# Patient Record
Sex: Female | Born: 1970 | Race: White | Hispanic: No | Marital: Married | Smoking: Current every day smoker
Health system: Southern US, Community
[De-identification: ages and names within clinical notes are randomized; demographics above are authoritative.]

## PROBLEM LIST (undated history)

## (undated) DIAGNOSIS — F419 Anxiety disorder, unspecified: Secondary | ICD-10-CM

## (undated) DIAGNOSIS — E119 Type 2 diabetes mellitus without complications: Secondary | ICD-10-CM

## (undated) DIAGNOSIS — E785 Hyperlipidemia, unspecified: Secondary | ICD-10-CM

## (undated) DIAGNOSIS — T7840XA Allergy, unspecified, initial encounter: Secondary | ICD-10-CM

## (undated) DIAGNOSIS — F32A Depression, unspecified: Secondary | ICD-10-CM

## (undated) HISTORY — DX: Allergy, unspecified, initial encounter: T78.40XA

## (undated) HISTORY — DX: Depression, unspecified: F32.A

## (undated) HISTORY — DX: Type 2 diabetes mellitus without complications: E11.9

## (undated) HISTORY — DX: Hyperlipidemia, unspecified: E78.5

## (undated) HISTORY — DX: Anxiety disorder, unspecified: F41.9

---

## 1998-01-25 ENCOUNTER — Inpatient Hospital Stay (HOSPITAL_COMMUNITY): Admission: EM | Admit: 1998-01-25 | Discharge: 1998-01-27 | Payer: Self-pay | Admitting: Emergency Medicine

## 1999-12-23 ENCOUNTER — Other Ambulatory Visit: Admission: RE | Admit: 1999-12-23 | Discharge: 1999-12-23 | Payer: Self-pay | Admitting: Obstetrics & Gynecology

## 2001-01-04 ENCOUNTER — Other Ambulatory Visit: Admission: RE | Admit: 2001-01-04 | Discharge: 2001-01-04 | Payer: Self-pay | Admitting: Obstetrics & Gynecology

## 2002-08-03 ENCOUNTER — Encounter: Admission: RE | Admit: 2002-08-03 | Discharge: 2002-08-03 | Payer: Self-pay | Admitting: Family Medicine

## 2002-08-03 ENCOUNTER — Encounter: Payer: Self-pay | Admitting: Family Medicine

## 2002-08-09 ENCOUNTER — Other Ambulatory Visit: Admission: RE | Admit: 2002-08-09 | Discharge: 2002-08-09 | Payer: Self-pay | Admitting: Obstetrics & Gynecology

## 2002-11-17 ENCOUNTER — Encounter (INDEPENDENT_AMBULATORY_CARE_PROVIDER_SITE_OTHER): Payer: Self-pay | Admitting: Specialist

## 2002-11-17 ENCOUNTER — Ambulatory Visit (HOSPITAL_COMMUNITY): Admission: RE | Admit: 2002-11-17 | Discharge: 2002-11-17 | Payer: Self-pay | Admitting: Gastroenterology

## 2005-02-26 ENCOUNTER — Encounter: Admission: RE | Admit: 2005-02-26 | Discharge: 2005-02-26 | Payer: Self-pay | Admitting: Family Medicine

## 2005-03-06 ENCOUNTER — Encounter: Admission: RE | Admit: 2005-03-06 | Discharge: 2005-03-06 | Payer: Self-pay | Admitting: Family Medicine

## 2005-03-13 ENCOUNTER — Encounter: Admission: RE | Admit: 2005-03-13 | Discharge: 2005-03-13 | Payer: Self-pay | Admitting: Internal Medicine

## 2005-03-28 ENCOUNTER — Encounter: Admission: RE | Admit: 2005-03-28 | Discharge: 2005-03-28 | Payer: Self-pay | Admitting: Family Medicine

## 2006-07-07 ENCOUNTER — Encounter: Admission: RE | Admit: 2006-07-07 | Discharge: 2006-07-07 | Payer: Self-pay | Admitting: Family Medicine

## 2006-11-08 IMAGING — CT CT ABDOMEN W/ CM
1 of 3 series · 14 of 32 positions shown, 19 images · IV contrast (GASTRO. & [ID] OMNI 300)
Comparison: none

CLINICAL DATA: Amenorrhea, abnormal testosterone level for four years.  Occasional abdominal pain, diarrhea.  Prior cholecystectomy.
ABDOMINAL CT WITH CONTRAST ? 03/06/05:

[Series 2: — · axial · 0.82mm/px · z∈[-454,+6]mm · 14 of 101 slices shown, 19 images]
[im 6/101  soft-tissue]
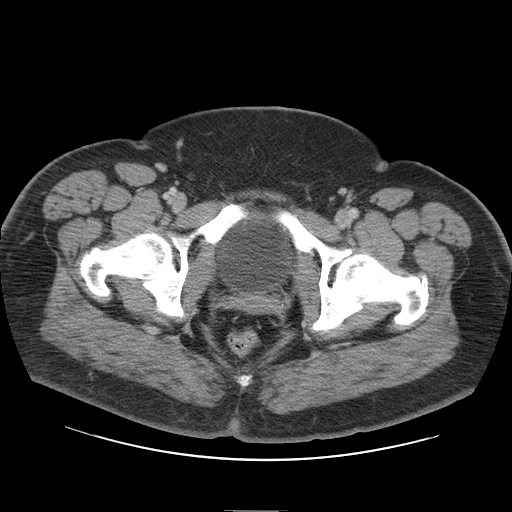
[im 6/101  bone]
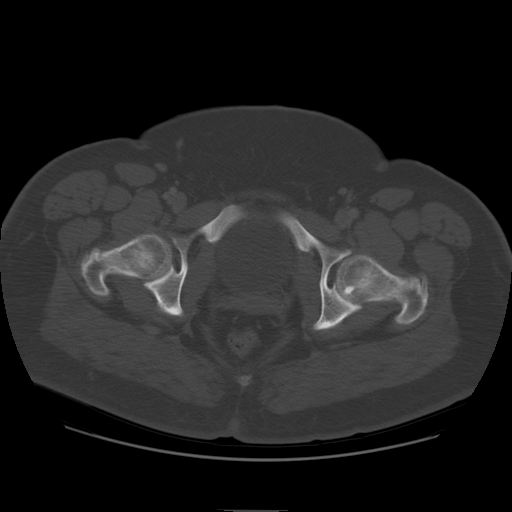
[im 16/101  soft-tissue]
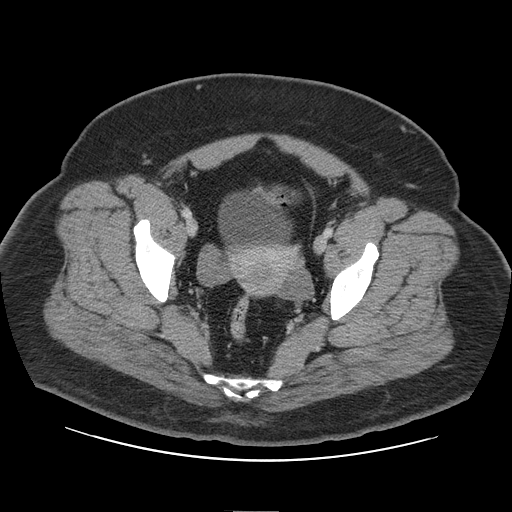
[im 22/101  soft-tissue]
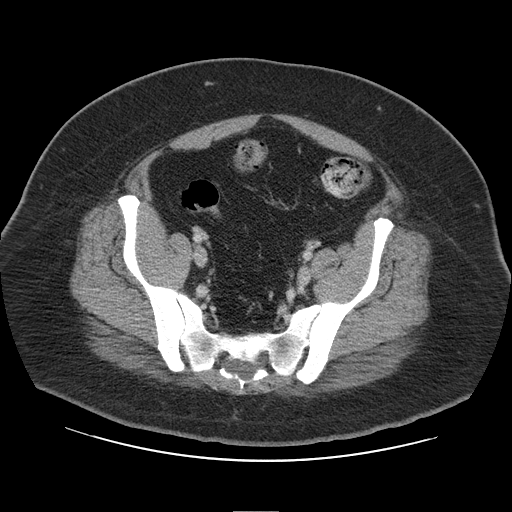
[im 27/101  soft-tissue]
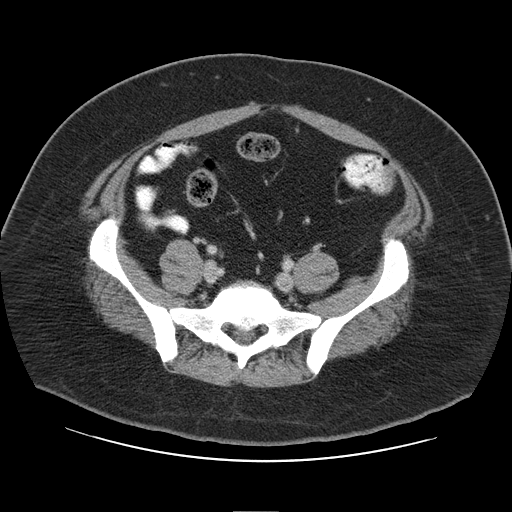
[im 37/101  soft-tissue]
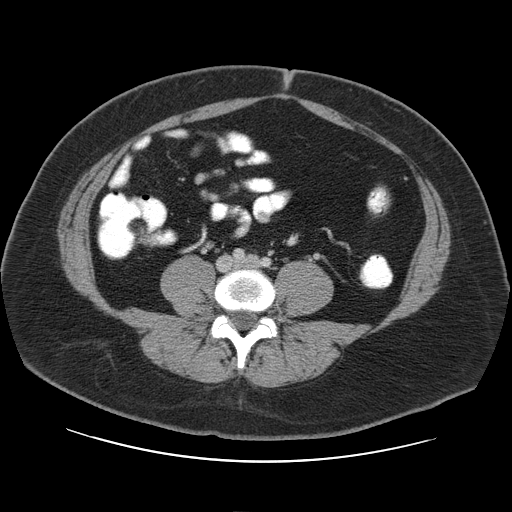
[im 43/101  soft-tissue]
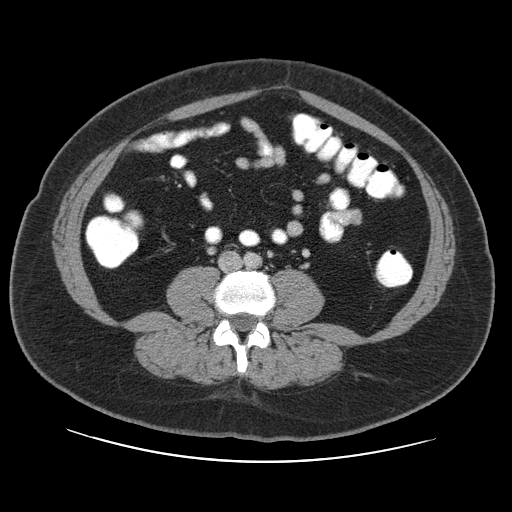
[im 53/101  soft-tissue]
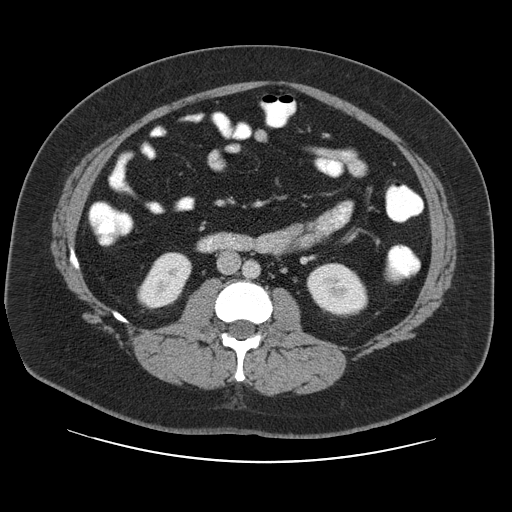
[im 58/101  soft-tissue]
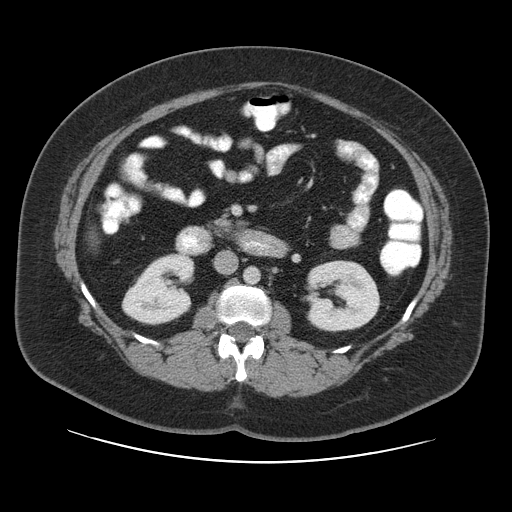
[im 64/101  soft-tissue]
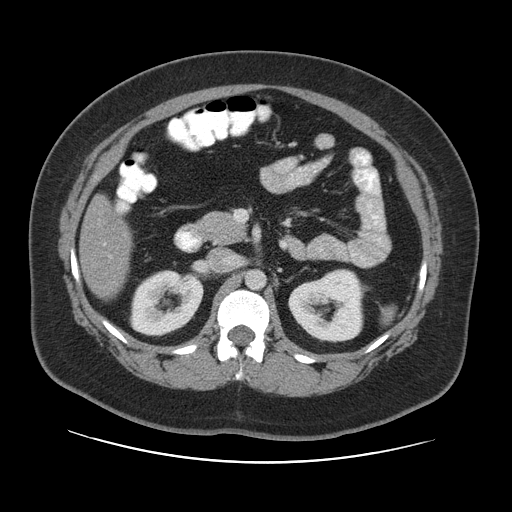
[im 64/101  bone]
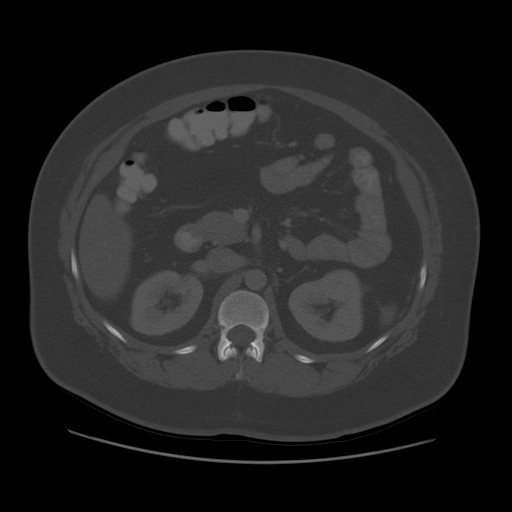
[im 74/101  soft-tissue]
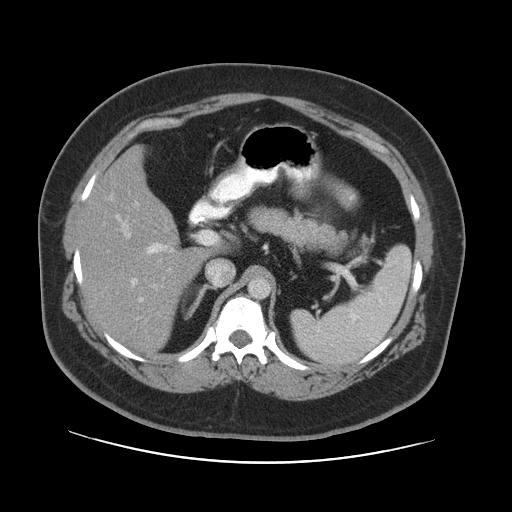
[im 79/101  soft-tissue]
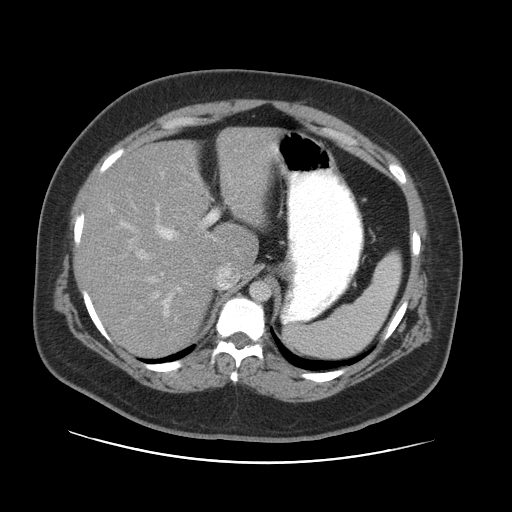
[im 79/101  lung]
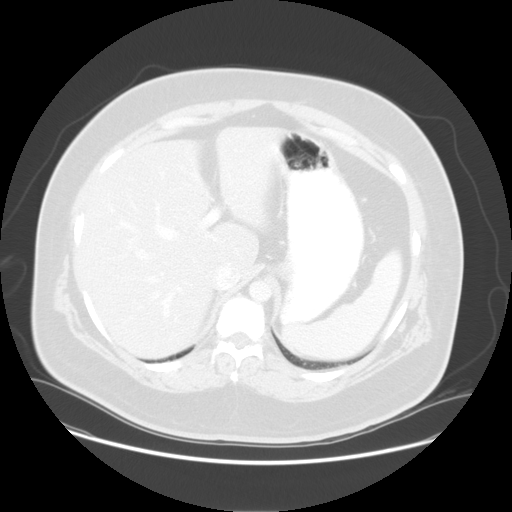
[im 85/101  soft-tissue]
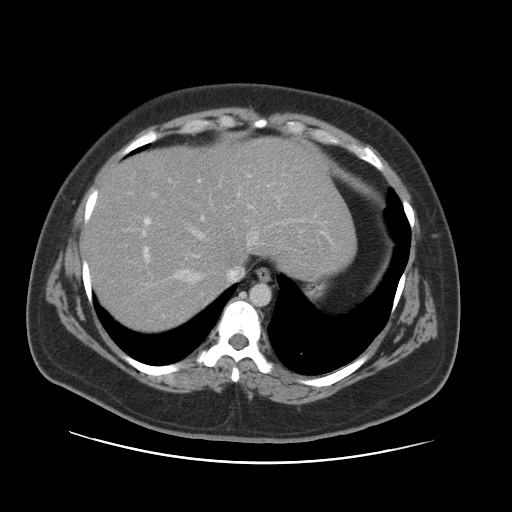
[im 85/101  lung]
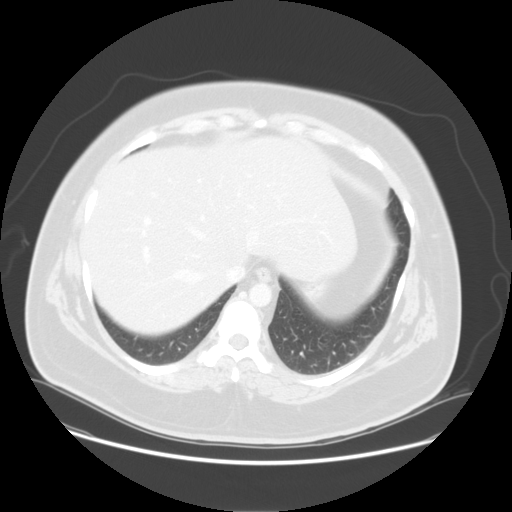
[im 90/101  lung]
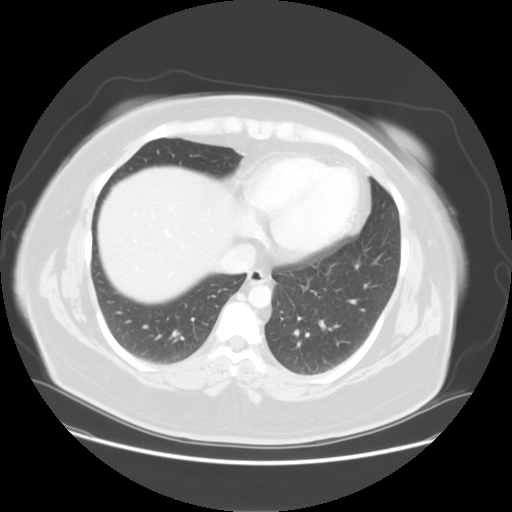
[im 95/101  soft-tissue]
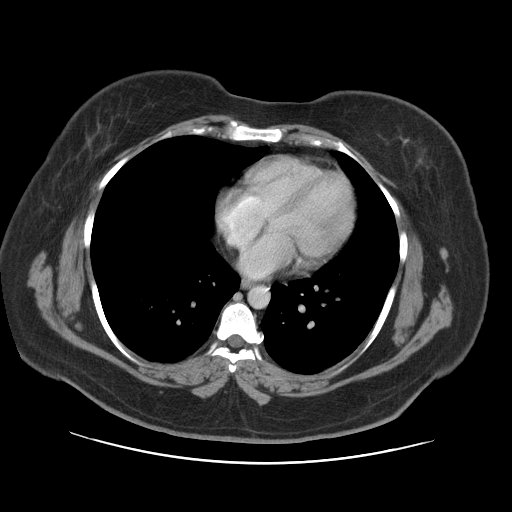
[im 95/101  lung]
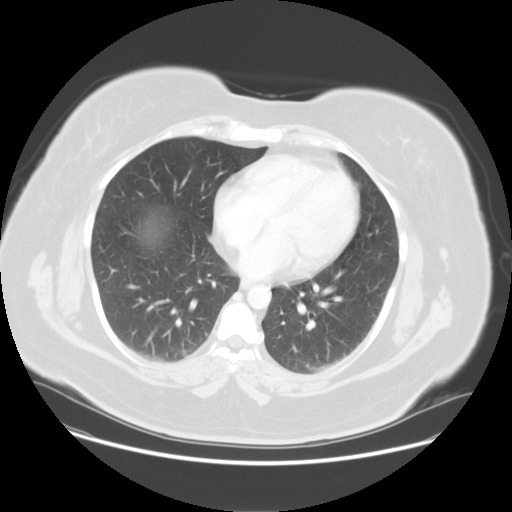

[14 of 32 positions shown; findings below may reference images not displayed]

FINDINGS: Multidetector spiral axial images were obtained through the abdomen following oral contrast and IV administration of  125 cc of Omnipaque 300 and comparison made with previous digitized [REDACTED] abdominal ultrasound of 08/03/02.  Since that study, there is no interval change in slight to moderate diffuse fatty infiltration of the liver findings.  Patient is post cholecystectomy with no dilated biliary tract.  No adrenal masses are seen.  Bases of the lungs are clear.  Remaining abdominal organs appear normal without free fluid, adenopathy, nor inflammation.
IMPRESSION: Since [REDACTED] abdominal ultrasound of 08/03/02, no interval change:
1.  Slight to moderate fatty infiltration of the liver.
2  Post cholecystectomy.
3.  Otherwise negative.
PELVIC CT WITH CONTRAST ? 03/06/05:
Routine spiral CT of the pelvis was performed. Omnipaque intravenous contrast and oral contrast were administered. 

The pelvic structures are normal in appearance. There is no evidence of masses, adenopathy, inflammatory process, or abnormal fluid collections.   Developmental, benign appearing midsacral arachnoid cyst measures 1.9 cm AP by 4 cm wide (image 80) with small benign appearing bone island at the left femoral head.
IMPRESSION: No significant abnormality (this does not preclude possibility of polycystic ovarian disease or small testosterone secreting ovarian neoplasm--pelvic ultrasound may be contributory as clinically indicated).

## 2006-12-22 ENCOUNTER — Inpatient Hospital Stay (HOSPITAL_COMMUNITY): Admission: RE | Admit: 2006-12-22 | Discharge: 2006-12-27 | Payer: Self-pay | Admitting: Neurosurgery

## 2006-12-22 ENCOUNTER — Encounter (INDEPENDENT_AMBULATORY_CARE_PROVIDER_SITE_OTHER): Payer: Self-pay | Admitting: *Deleted

## 2007-03-20 ENCOUNTER — Encounter: Admission: RE | Admit: 2007-03-20 | Discharge: 2007-03-20 | Payer: Self-pay | Admitting: Neurosurgery

## 2007-09-28 ENCOUNTER — Encounter: Admission: RE | Admit: 2007-09-28 | Discharge: 2007-09-28 | Payer: Self-pay | Admitting: Neurosurgery

## 2010-11-17 ENCOUNTER — Encounter: Payer: Self-pay | Admitting: Family Medicine

## 2011-03-14 NOTE — Op Note (Signed)
   Elizabeth [name], Elizabeth [name]                            ACCOUNT NO.:  [id number]   MEDICAL RECORD NO.:  [id number]                   PATIENT TYPE:  AMB   LOCATION:  ENDO                                 FACILITY:  [facility]   PHYSICIAN:  Anselmo Rod, M.D.               DATE OF BIRTH:  1971/01/21   DATE OF PROCEDURE:  11/17/2002  DATE OF DISCHARGE:                                 OPERATIVE REPORT   PROCEDURE PERFORMED:  Colonoscopy with biopsies.   ENDOSCOPIST:  Anselmo Rod, M.D.   INSTRUMENT USED:  Olympus video colonoscope.   INDICATIONS:  Guaiac-positive stool in a 40 year old white female with a  history of diarrhea, rule out colonic polyps, masses, etc.   PREPROCEDURE PREPARATION:  Informed consent was procured from the patient,  and the patient was fasted for eight hours prior to the procedure and  prepped with a bottle of MiraLax the night prior to the procedure.   PREPROCEDURE PHYSICAL:  VITAL SIGNS:  The patient had stable vital signs.  NECK:  Supple.  CHEST:  Clear to auscultation.  S1, S2 regular.  ABDOMEN:  Soft with normal bowel sounds.  No masses palpable.   DESCRIPTION OF PROCEDURE:  The patient was placed in the left lateral  decubitus position, sedated with 60 mg of Demerol and 7 mg of Versed  intravenously.  Once the patient was adequately sedated and maintained on  low-flow oxygen and continuous cardiac monitoring, the Olympus video  colonoscope was advanced from the rectum to the cecum with difficulty.  There was a large amount of residual stool in the colon.  Two small sessile  polyps were biopsied at 40 cm.  No other abnormalities were noted.  No  masses, polyps, erosions, or ulcerations were present in the rest of the  colon.  The terminal ileum appeared normal, and visualization was somewhat  restricted because of residual stool in the colon, but no large masses or  polyps were present.  Small lesions could have been missed.  The patient  tolerated the  procedure well without complications.   IMPRESSION:  Two small sessile polyps, biopsied at 40 cm; otherwise,  unrevealing colonoscopy of the terminal ileum.    RECOMMENDATIONS:  1. Await pathology results.  2. Avoid all nonsteroidals including aspirin.  3. Outpatient followup in the next two weeks for further recommendations.                                               Anselmo Rod, M.D.    JNM/MEDQ  D:  11/18/2002  T:  11/19/2002  Job:  [id number]   cc:   Talmadge Coventry, M.D.  [address]  [place]  [place] [address]  Fax: [phone]

## 2011-03-14 NOTE — Discharge Summary (Signed)
NAMEHEIDIE, KRALL                ACCOUNT NO.:  [id number]   MEDICAL RECORD NO.:  [id number]          PATIENT TYPE:  INP   LOCATION:  3030                         FACILITY:  [facility]   PHYSICIAN:  Coletta Memos, M.D.     DATE OF BIRTH:  1970-12-21   DATE OF ADMISSION:  12/22/2006  DATE OF DISCHARGE:                               DISCHARGE SUMMARY   ADMITTING DIAGNOSIS:  Pituitary tumor.   DISCHARGE DIAGNOSIS:  Rathke's cleft cyst.   COMPLICATIONS:  None.   DISCHARGE STATUS:  Alive and well.   HOSPITAL COURSE:  She underwent a right pterional craniotomy for the  cyst resection.  Postoperatively, Ms. Rotter has had normal visual  fields.  Pupils equal, round, reactive to light.  She is tolerating a  regular diet.  She has been able walk without difficulty and has voided  without difficulty.  She will be sent home on a very brief Decadron  taper 2 mg b.i.d. for 2 days.  She was also given Tylenol #3 for pain.  She was given instructions to call Dr. Channing Mutters for a suture removal  appointment.           ______________________________  Coletta Memos, M.D.     KC/MEDQ  D:  12/27/2006  T:  12/27/2006  Job:  [id number]

## 2011-03-14 NOTE — Op Note (Signed)
[name], [name]                ACCOUNT NO.:  [id number]   MEDICAL RECORD NO.:  [id number]          PATIENT TYPE:  INP   LOCATION:  2899                         FACILITY:  [facility]   PHYSICIAN:  Payton Doughty, M.D.      DATE OF BIRTH:  Sep 28, 1971   DATE OF PROCEDURE:  12/22/2006  DATE OF DISCHARGE:                               OPERATIVE REPORT   PREOPERATIVE DIAGNOSES:  Pituitary tumor with optic chiasm compression.   POSTOPERATIVE DIAGNOSES:  Pituitary tumor with optic chiasm compression.   OPERATIVE PROCEDURE:  Right pterional craniotomy for tumor resection.   SURGEON:  Payton Doughty, M.D.   DOCTOR ASSISTANT:  Stefani Dama, M.D.   NURSE ASSISTANT:  Ferris.   SERVICE:  Neurosurgery.   ANESTHESIA:  General endotracheal.   PREPARATION:  Betadine Scrub with alcohol wipe.   COMPLICATIONS:  None.   BODY OF TEXT:  This is a 40 year old girl with probably a nonsecretory  pituitary tumor compressing her optic chiasm.  She was taken to the  operating room, smoothly anesthetized, intubated and placed supine in  the [place] headholder.  Following shave, prep and drape in the usual  sterile fashion, the skin was infiltrated with 1% lidocaine with  1:400,000 epinephrine.  A skin incision was started at the zygoma in  front of the right ear and carried superiorly to the top of the ear, and  then back to the back of the ear superior to the superior temporal line,  and then forward to the midline at the hairline.  The bone was flapped  forward with the temporalis muscle.  A pterional flap was turned with  bur holes on each side in the pterion, one low in the temporal squama  and one posterior to the superior temporal line.  The flap was turned  and the lateral portion of the sphenoid wing taken down.  The dura was  opened and the frontal lobe elevated.  When the olfactory nerve was  viewed, the retractor was locked in.  Gentle retraction was placed on  the temporal lobe and a vein  at the temporal tip was taken with the  bipolar and divided.  The sylvian fissure was then opened, providing a  view of the optic nerve and the carotid artery, as well as the optic  chiasm.  Working rostral to the chiasm between the optic nerves, the  tumor was identified.  The capsule was punctured and tumor evacuated,  resulting in an immediate decompression of the optic nerve.  The optic  nerve itself was untouched.  Once the tumor was completely evacuated,  the wound was irrigated and hemostasis assured.  The  dura was reapproximated with 4-0 Nurolon.  The bone flap was replaced  with Osteomed plates.  The temporalis muscle and galea were closed with  2-0 Vicryl, and the skin was closed with 3-0 nylon.  A Betadine Telfa  dressing was applied and the patient returned to the recovery room in  good condition.           ______________________________  Payton Doughty, M.D.  MWR/MEDQ  D:  12/22/2006  T:  12/22/2006  Job:  [id number]

## 2011-03-14 NOTE — H&P (Signed)
Elizabeth [name], Elizabeth [name]                ACCOUNT NO.:  [id number]   MEDICAL RECORD NO.:  [id number]          PATIENT TYPE:  INP   LOCATION:  3172                         FACILITY:  [facility]   PHYSICIAN:  Payton Doughty, M.D.      DATE OF BIRTH:  12-31-1970   DATE OF ADMISSION:  12/22/2006  DATE OF DISCHARGE:                              HISTORY & PHYSICAL   ADMITTING DIAGNOSIS:  Pituitary tumor.   HISTORY:  This is a very nice 39 year old right-handed white girl who  was being evaluated for polycystic ovaries and found on MR to have a  pituitary tumor in June of 2006.  It has been followed for a bit and has  increased in size.  She is having visual difficulties, and she reported  to my office in January with her MR.   MEDICAL HISTORY:  Remarkable for polycystic ovary.  She is on  spironolactone 100 mg twice a day and metformin 50 mg twice a day, she  also takes an oral contraceptive once a day.   ALLERGIES:  None.   SURGICAL HISTORY:  A hand operation in 1991 by Dr. Merlyn Lot,  cholecystectomy in 1993 and pelvic exploration for female difficulties.   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   FAMILY HISTORY:  Mom is 66 with diabetes and hypertension, dad is 85  with thyroid disease.   REVIEW OF SYSTEMS:  Remarkable for wearing glasses, hearing loss,  jaundice and hormonal problems as noted during the time, reports she is  on medication for polycystic ovaries, he LFTs are elevated.   PHYSICAL EXAMINATION:  HEENT EXAM:  Within normal limits.  NECK:  She had good range of motion of neck.  CHEST:  Clear.  CARDIAC EXAM:  Regular rate and rhythm.  NEUROLOGICAL:  She is awake, alert and oriented.  She has a field cut  above 30 degrees from both superior quadrants, it is pretty symmetric  although she states she notices it more on the right than on the left.  Remainder of her neurologic exam is  intact.   MR shows a pituitary tumor that elevates __________.   CLINICAL IMPRESSION:  Nonsecreting pituitary tumor with visual-field  difficulties.   The plan is for a right frontal craniotomy for tumor debulking and  resection and then visit with Dr. Madaline Guthrie for gamma knife.           ______________________________  Payton Doughty, M.D.     MWR/MEDQ  D:  12/22/2006  T:  12/22/2006  Job:  [id number]

## 2020-09-29 ENCOUNTER — Ambulatory Visit: Payer: Self-pay | Attending: Internal Medicine

## 2020-09-29 ENCOUNTER — Other Ambulatory Visit: Payer: Self-pay

## 2020-09-29 DIAGNOSIS — Z23 Encounter for immunization: Secondary | ICD-10-CM

## 2020-09-29 NOTE — Progress Notes (Signed)
   Covid-19 Vaccination Clinic  Name:  Elizabeth [name]    MRN: [id number] DOB: 06-04-1971  09/29/2020  Elizabeth [name] was observed post Covid-19 immunization for 15 minutes without incident. She was provided with Vaccine Information Sheet and instruction to access the V-Safe system.   Elizabeth [name] was instructed to call 911 with any severe reactions post vaccine: Marland Kitchen Difficulty breathing  . Swelling of face and throat  . A fast heartbeat  . A bad rash all over body  . Dizziness and weakness   Immunizations Administered    Name Date Dose VIS Date Route   Pfizer COVID-19 Vaccine 09/29/2020 11:33 AM 0.3 mL 08/15/2020 Intramuscular   Manufacturer: [employer], [employer]   Lot: [id number]   NDC: [address]-7

## 2021-05-03 ENCOUNTER — Encounter: Payer: Self-pay | Admitting: Physical Medicine and Rehabilitation

## 2021-08-16 ENCOUNTER — Encounter: Payer: Self-pay | Admitting: Physical Medicine and Rehabilitation

## 2021-08-16 ENCOUNTER — Encounter
Payer: BC Managed Care – PPO | Attending: Physical Medicine and Rehabilitation | Admitting: Physical Medicine and Rehabilitation

## 2021-08-16 ENCOUNTER — Other Ambulatory Visit: Payer: Self-pay

## 2021-08-16 VITALS — BP 102/66 | HR 84 | Ht 67.0 in | Wt 192.4 lb

## 2021-08-16 DIAGNOSIS — M797 Fibromyalgia: Secondary | ICD-10-CM | POA: Insufficient documentation

## 2021-08-16 MED ORDER — N-ACETYL CYSTEINE 600 MG PO TABS: 1.0000 | ORAL_TABLET | Freq: Two times a day (BID) | ORAL | 1 refills | Status: AC

## 2021-08-16 NOTE — Progress Notes (Signed)
Subjective:    Patient ID: Elizabeth [name], female    DOB: [birth year], 50 y.o.   MRN: [id number]  HPI [name] is a 50 year old woman who presents to establish care for fibromylagia.  1) Fibromyalgia -she has all the symptoms, but has always felt like there is something else going on -she gets tremor, headaches, weakness -yesterday was a bad day -since she has had head surgery her symptoms have been worse and worse and worse -she has seen a chiropractor and was told that her back was straight as an arrow -she gets pain in her back, hips, knees, -some days she can do things and some days she cannot -she takes gabapentin, she switched to lyrica for a while but this was not as effective.  -the gabapentin helps a little bit, she takes 600mg  BID -she has shakes throughout her body- sometimes in breast.  -it feels like a vibration on the inside -she has had a TENS unit and sometimes it helps, sometimes she doesn't  Pain Inventory Average Pain 7 Pain Right Now 8 My pain is intermittent, constant, sharp, dull, and aching  In the last 24 hours, has pain interfered with the following? General activity 8 Relation with others 6 Enjoyment of life 8 What TIME of day is your pain at its worst? morning , daytime, evening, and night Sleep (in general) Fair  Pain is worse with: walking, bending, sitting, inactivity, and standing Pain improves with: rest Relief from Meds: 3  walk without assistance how many minutes can you walk? 10 ability to climb steps?  yes do you drive?  yes  not employed: date last employed 05/2017  weakness numbness tingling trouble walking spasms confusion depression anxiety  Any changes since last visit?  no  Any changes since last visit?  no    No family history on file. Social History   Socioeconomic History   Marital status: Married    Spouse name: Not on file   Number of children: Not on file   Years of education: Not on file   Highest  education level: Not on file  Occupational History   Not on file  Tobacco Use   Smoking status: Every Day    Packs/day: 1.00    Types: Cigarettes   Smokeless tobacco: Never  Vaping Use   Vaping Use: Never used  Substance and Sexual Activity   Alcohol use: Not Currently    Alcohol/week: 2.0 standard drinks    Types: 1 Glasses of wine, 1 Cans of beer per week   Drug use: Not Currently    Types: Marijuana   Sexual activity: Not on file  Other Topics Concern   Not on file  Social History Narrative   Not on file   Social Determinants of Health   Financial Resource Strain: Not on file  Food Insecurity: Not on file  Transportation Needs: Not on file  Physical Activity: Not on file  Stress: Not on file  Social Connections: Not on file   Past Medical History:  Diagnosis Date   Allergy    Anxiety    Depression    Diabetes mellitus without complication (HCC)    Hyperlipidemia    Ht 5\' 7"  (1.702 m)   Wt 192 lb 6.4 oz (87.3 kg)   BMI 30.13 kg/m   Opioid Risk Score:   Fall Risk Score:  `1  Depression screen PHQ 2/9  Depression screen PHQ 2/9 08/16/2021  Decreased Interest 2  Down, Depressed, Hopeless 2  PHQ - 2 Score 4  Altered sleeping 3  Tired, decreased energy 3  Change in appetite 1  Feeling bad or failure about yourself  3  Trouble concentrating 3  Moving slowly or fidgety/restless 1  Suicidal thoughts 0  PHQ-9 Score 18  Difficult doing work/chores Very difficult      Review of Systems  Constitutional: Negative.   HENT: Negative.    Eyes: Negative.   Respiratory: Negative.    Cardiovascular: Negative.   Gastrointestinal:  Positive for constipation.  Endocrine: Negative.   Genitourinary: Negative.   Musculoskeletal:  Positive for back pain and gait problem.  Skin: Negative.   Allergic/Immunologic: Negative.   Neurological:  Positive for weakness and numbness.  Hematological: Negative.   Psychiatric/Behavioral:  Positive for confusion and dysphoric  mood. The patient is nervous/anxious.       Objective:   Physical Exam Gen: no distress, normal appearing HEENT: oral mucosa pink and moist, NCAT Cardio: Reg rate Chest: normal effort, normal rate of breathing Abd: soft, non-distended Ext: no edema Psych: pleasant, normal affect Skin: intact Neuro: Alert and oriented x3 Musculoskeletal: Diffuse tenderness to palpation    Assessment & Plan:  1) Fibromyalgia is a clinical syndrome characterized by widespread pain and tenderness in addition to a variety of symptoms, including fatigue, anxiety, depression, and sleep disturbances. Fibromyalgia affects 3-5% of women and up to 25% of women with other rheumatological conditions.   Exercise is a first-line treatment for the disease, and can help with both the physical and emotional symptoms, as well as improving overall health and function.    -Discussed current symptoms of pain and history of pain.  -Discussed benefits of exercise in reducing pain. -Discussed following foods that may reduce pain: 1) Ginger (especially studied for arthritis)- reduce leukotriene production to decrease inflammation 2) Blueberries- high in phytonutrients that decrease inflammation 3) Salmon- marine omega-3s reduce joint swelling and pain 4) Pumpkin seeds- reduce inflammation 5) dark chocolate- reduces inflammation 6) turmeric- reduces inflammation 7) tart cherries - reduce pain and stiffness 8) extra virgin olive oil - its compound olecanthal helps to block prostaglandins  9) chili peppers- can be eaten or applied topically via capsaicin 10) mint- helpful for headache, muscle aches, joint pain, and itching 11) garlic- reduces inflammation  Link to further information on diet for chronic pain: http://www.bray.com/    2) Obesity BMI 30.13 Obesity: -Educated that current weight is 192 lbs and current BMI is 30.13 -Educated regarding  health benefits of weight loss- for pain, general health, chronic disease prevention, immune health, mental health.  -Will monitor weight every visit.  -Consider Roobois tea daily.  -Discussed the benefits of intermittent fasting. -Discussed foods that can assist in weight loss: 1) leafy greens- high in fiber and nutrients 2) dark chocolate- improves metabolism (if prefer sweetened, best to sweeten with honey instead of sugar).  3) cruciferous vegetables- high in fiber and protein 4) full fat yogurt: high in healthy fat, protein, calcium, and probiotics 5) apples- high in a variety of phytochemicals 6) nuts- high in fiber and protein that increase feelings of fullness 7) grapefruit: rich in nutrients, antioxidants, and fiber (not to be taken with anticoagulation) 8) beans- high in protein and fiber 9) salmon- has high quality protein and healthy fats 10) green tea- rich in polyphenols 11) eggs- rich in choline and vitamin D 12) tuna- high protein, boosts metabolism 13) avocado- decreases visceral abdominal fat 14) chicken (pasture raised): high in protein and iron 15) blueberries- reduce abdominal fat  and cholesterol 16) whole grains- decreases calories retained during digestion, speeds metabolism 17) chia seeds- curb appetite 18) chilies- increases fat metabolism  -Discussed supplements that can be used:  1) Metatrim 400mg  BID 30 minutes before breakfast and dinner  2) Sphaeranthus indicus and Garcinia mangostana (combinations of these and #1 can be found in capsicum and zychrome  3) green coffee bean extract 400mg  twice per day or Irvingia (african mango) 150 to 300mg  twice per day.

## 2021-08-16 NOTE — Patient Instructions (Addendum)
Foods that may reduce pain: 1) Ginger (especially studied for arthritis)- reduce leukotriene production to decrease inflammation 2) Blueberries- high in phytonutrients that decrease inflammation 3) Salmon- marine omega-3s reduce joint swelling and pain 4) Pumpkin seeds- reduce inflammation 5) dark chocolate- reduces inflammation 6) turmeric- reduces inflammation 7) tart cherries - reduce pain and stiffness 8) extra virgin olive oil - its compound olecanthal helps to block prostaglandins  9) chili peppers- can be eaten or applied topically via capsaicin 10) mint- helpful for headache, muscle aches, joint pain, and itching 11) garlic- reduces inflammation  Link to further information on diet for chronic pain: http://www.bray.com/    Foods that can assist in weight loss: 1) leafy greens- high in fiber and nutrients 2) dark chocolate- improves metabolism (if prefer sweetened, best to sweeten with honey instead of sugar).  3) cruciferous vegetables- high in fiber and protein 4) full fat yogurt: high in healthy fat, protein, calcium, and probiotics 5) apples- high in a variety of phytochemicals 6) nuts- high in fiber and protein that increase feelings of fullness 7) grapefruit: rich in nutrients, antioxidants, and fiber (not to be taken with anticoagulation) 8) beans- high in protein and fiber 9) salmon- has high quality protein and healthy fats 10) green tea- rich in polyphenols 11) eggs- rich in choline and vitamin D 12) tuna- high protein, boosts metabolism 13) avocado- decreases visceral abdominal fat 14) chicken (pasture raised): high in protein and iron 15) blueberries- reduce abdominal fat and cholesterol 16) whole grains- decreases calories retained during digestion, speeds metabolism 17) chia seeds- curb appetite 18) chilies- increases fat metabolism  -Discussed supplements that can be used:  1) Metatrim  400mg  BID 30 minutes before breakfast and dinner  2) Sphaeranthus indicus and Garcinia mangostana (combinations of these and #1 can be found in capsicum and zychrome  3) green coffee bean extract 400mg  twice per day or Irvingia (african mango) 150 to 300mg  twice per day.  Benefits of Yoga  There are numerous studies regarding the health benefits of yoga:  -improves strength, balance, and flexibility  -prevents and alleviates back pain  -alleviates symptoms of arthritis  -benefits heart health  -aids relaxation, better sleep  -improves energy and brightens mood, boosts alertness  -decreases stress  -can be great socialization when done with others   Poses: -Tree pose (vrksasana) -Cat-cow pose (bitilasana) -Downward dog (adho mukha shvanasana) -Legs up the wall pose (viparita karani) -corpse pose (savasana)

## 2021-09-26 NOTE — Progress Notes (Signed)
Subjective:    Patient ID: Elizabeth [name], female    DOB: May 14, 1971, 50 y.o.   MRN: [id number]  HPI Mrs. Elizabeth [name] is a 50 year old woman who presents to establish care for fibromylagia.  1) Fibromyalgia -she has all the symptoms, but has always felt like there is something else going on -she gets tremor, headaches, weakness -yesterday was a bad day -since she has had head surgery her symptoms have been worse and worse and worse -she has seen a chiropractor and was told that her back was straight as an arrow -she gets pain in her back, hips, knees, -some days she can do things and some days she cannot -she takes gabapentin, she switched to lyrica for a while but this was not as effective.  -the gabapentin helps a little bit, she takes 600mg  BID -she has shakes throughout her body- sometimes in breast.  -it feels like a vibration on the inside -she has had a TENS unit and sometimes it helps, sometimes she doesn't  Pain Inventory Average Pain 7 Pain Right Now 8 My pain is intermittent, constant, sharp, dull, and aching  In the last 24 hours, has pain interfered with the following? General activity 8 Relation with others 6 Enjoyment of life 8 What TIME of day is your pain at its worst? morning , daytime, evening, and night Sleep (in general) Fair  Pain is worse with: walking, bending, sitting, inactivity, and standing Pain improves with: rest Relief from Meds: 3  walk without assistance how many minutes can you walk? 10 ability to climb steps?  yes do you drive?  yes  not employed: date last employed 05/2017  weakness numbness tingling trouble walking spasms confusion depression anxiety  Any changes since last visit?  no  Any changes since last visit?  no    No family history on file. Social History   Socioeconomic History  . Marital status: Married    Spouse name: Not on file  . Number of children: Not on file  . Years of education: Not on file  . Highest  education level: Not on file  Occupational History  . Not on file  Tobacco Use  . Smoking status: Every Day    Packs/day: 1.00    Types: Cigarettes  . Smokeless tobacco: Never  Vaping Use  . Vaping Use: Never used  Substance and Sexual Activity  . Alcohol use: Not Currently    Alcohol/week: 2.0 standard drinks    Types: 1 Glasses of wine, 1 Cans of beer per week  . Drug use: Not Currently    Types: Marijuana  . Sexual activity: Not on file  Other Topics Concern  . Not on file  Social History Narrative  . Not on file   Social Determinants of Health   Financial Resource Strain: Not on file  Food Insecurity: Not on file  Transportation Needs: Not on file  Physical Activity: Not on file  Stress: Not on file  Social Connections: Not on file   Past Medical History:  Diagnosis Date  . Allergy   . Anxiety   . Depression   . Diabetes mellitus without complication (HCC)   . Hyperlipidemia    There were no vitals taken for this visit.  Opioid Risk Score:   Fall Risk Score:  `1  Depression screen PHQ 2/9  Depression screen PHQ 2/9 08/16/2021  Decreased Interest 2  Down, Depressed, Hopeless 2  PHQ - 2 Score 4  Altered sleeping 3  Tired,  decreased energy 3  Change in appetite 1  Feeling bad or failure about yourself  3  Trouble concentrating 3  Moving slowly or fidgety/restless 1  Suicidal thoughts 0  PHQ-9 Score 18  Difficult doing work/chores Very difficult      Review of Systems  Constitutional: Negative.   HENT: Negative.    Eyes: Negative.   Respiratory: Negative.    Cardiovascular: Negative.   Gastrointestinal:  Positive for constipation.  Endocrine: Negative.   Genitourinary: Negative.   Musculoskeletal:  Positive for back pain and gait problem.  Skin: Negative.   Allergic/Immunologic: Negative.   Neurological:  Positive for weakness and numbness.  Hematological: Negative.   Psychiatric/Behavioral:  Positive for confusion and dysphoric mood. The  patient is nervous/anxious.       Objective:   Physical Exam Gen: no distress, normal appearing HEENT: oral mucosa pink and moist, NCAT Cardio: Reg rate Chest: normal effort, normal rate of breathing Abd: soft, non-distended Ext: no edema Psych: pleasant, normal affect Skin: intact Neuro: Alert and oriented x3 Musculoskeletal: Diffuse tenderness to palpation    Assessment & Plan:  1) Fibromyalgia is a clinical syndrome characterized by widespread pain and tenderness in addition to a variety of symptoms, including fatigue, anxiety, depression, and sleep disturbances. Fibromyalgia affects 3-5% of women and up to 25% of women with other rheumatological conditions.   Exercise is a first-line treatment for the disease, and can help with both the physical and emotional symptoms, as well as improving overall health and function.    -Discussed current symptoms of pain and history of pain.  -Discussed benefits of exercise in reducing pain. -Discussed following foods that may reduce pain: 1) Ginger (especially studied for arthritis)- reduce leukotriene production to decrease inflammation 2) Blueberries- high in phytonutrients that decrease inflammation 3) Salmon- marine omega-3s reduce joint swelling and pain 4) Pumpkin seeds- reduce inflammation 5) dark chocolate- reduces inflammation 6) turmeric- reduces inflammation 7) tart cherries - reduce pain and stiffness 8) extra virgin olive oil - its compound olecanthal helps to block prostaglandins  9) chili peppers- can be eaten or applied topically via capsaicin 10) mint- helpful for headache, muscle aches, joint pain, and itching 11) garlic- reduces inflammation  Link to further information on diet for chronic pain: [email or web address]    2) Obesity BMI 30.13 Obesity: -Educated that current weight is 192 lbs and current BMI is 30.13 -Educated regarding health  benefits of weight loss- for pain, general health, chronic disease prevention, immune health, mental health.  -Will monitor weight every visit.  -Consider Ro[place]ois tea daily.  -Discussed the benefits of intermittent fasting. -Discussed foods that can assist in weight loss: 1) leafy greens- high in fiber and nutrients 2) dark chocolate- improves metabolism (if prefer sweetened, best to sweeten with honey instead of sugar).  3) cruciferous vegetables- high in fiber and protein 4) full fat yogurt: high in healthy fat, protein, calcium, and probiotics 5) apples- high in a variety of phytochemicals 6) nuts- high in fiber and protein that increase feelings of fullness 7) grapefruit: rich in nutrients, antioxidants, and fiber (not to be taken with anticoagulation) 8) beans- high in protein and fiber 9) salmon- has high quality protein and healthy fats 10) green tea- rich in polyphenols 11) eggs- rich in choline and vitamin D 12) tuna- high protein, boosts metabolism 13) avocado- decreases visceral abdominal fat 14) chicken (pasture raised): high in protein and iron 15) blueberries- reduce abdominal fat and cholesterol 16) whole grains- decreases calories retained during digestion, speeds  metabolism 17) chia seeds- curb appetite 18) chilies- increases fat metabolism  -Discussed supplements that can be used:  1) Metatrim 400mg  BID 30 minutes before breakfast and dinner  2) Sphaeranthus indicus and Garcinia mangostana (combinations of these and #1 can be found in capsicum and zychrome  3) green coffee bean extract 400mg  twice per day or Irvingia (african mango) 150 to 300mg  twice per day.  This encounter was created in error - please disregard.

## 2021-09-27 ENCOUNTER — Other Ambulatory Visit: Payer: Self-pay

## 2021-09-27 ENCOUNTER — Encounter: Payer: BC Managed Care – PPO | Admitting: Physical Medicine and Rehabilitation

## 2021-12-02 ENCOUNTER — Ambulatory Visit: Payer: BC Managed Care – PPO | Admitting: Physical Medicine and Rehabilitation

## 2022-03-04 ENCOUNTER — Ambulatory Visit: Payer: BC Managed Care – PPO | Admitting: Physical Medicine and Rehabilitation

## 2023-03-12 ENCOUNTER — Ambulatory Visit (HOSPITAL_COMMUNITY)
Admission: RE | Admit: 2023-03-12 | Discharge: 2023-03-12 | Disposition: A | Discharge status: Home / Self Care | Payer: BC Managed Care – PPO | Source: Ambulatory Visit | Attending: Sports Medicine | Admitting: Sports Medicine

## 2023-03-12 ENCOUNTER — Encounter (HOSPITAL_COMMUNITY): Payer: Self-pay

## 2023-03-12 VITALS — BP 91/64 | HR 104 | Temp 98.1°F | Resp 18

## 2023-03-12 DIAGNOSIS — L0291 Cutaneous abscess, unspecified: Secondary | ICD-10-CM | POA: Diagnosis not present

## 2023-03-12 LAB — AEROBIC CULTURE W GRAM STAIN (SUPERFICIAL SPECIMEN)

## 2023-03-12 MED ORDER — IBUPROFEN 800 MG PO TABS: 800.0000 mg | ORAL_TABLET | Freq: Three times a day (TID) | ORAL | 0 refills | Status: AC

## 2023-03-12 MED ORDER — SULFAMETHOXAZOLE-TRIMETHOPRIM 800-160 MG PO TABS: 1.0000 | ORAL_TABLET | Freq: Two times a day (BID) | ORAL | 0 refills | Status: AC

## 2023-03-12 NOTE — ED Triage Notes (Signed)
Pt reports an abscess on her vagina. States the abscess has been there for a period of time but "burst" 2 weeks ago. States the area has been draining pus, blood, and has a foul odor. Has been using an antibiotic cream.

## 2023-03-12 NOTE — ED Provider Notes (Signed)
[facility]    CSN: [id number] Arrival date & time: 03/12/23  4098      History   Chief Complaint Chief Complaint  Patient presents with   Abscess    Cyst that is infected on my vagin - Entered by patient    HPI Elizabeth [name] is a 52 y.o. female.   She is here today with chief complaint of draining vaginal cyst.  She reports it popped up a while back but has been increasing in size.  It ruptured about 2 weeks ago was inconsistently draining.  She reports its become more tender to touch recently and she is been wearing pads because of the drainage from the area.  This is keeping her from work as she was a lot of walking she is unable to that secondary to her discomfort.  She has had similar cysts in the past however never this large.  She has been using some triamcinolone cream from her primary care provider but not noting much of an improvement.  She denies any fevers or chills.   Abscess   Past Medical History:  Diagnosis Date   Allergy    Anxiety    Depression    Diabetes mellitus without complication (HCC)    Hyperlipidemia     There are no problems to display for this patient.   History reviewed. No pertinent surgical history.  OB History   No obstetric history on file.      Home Medications    Prior to Admission medications   Medication Sig Start Date End Date Taking? Authorizing Provider  ibuprofen (ADVIL) 800 MG tablet Take 1 tablet (800 mg total) by mouth 3 (three) times daily. 03/12/23  Yes Gillermo Murdoch A, DO  sulfamethoxazole-trimethoprim (BACTRIM DS) 800-160 MG tablet Take 1 tablet by mouth 2 (two) times daily for 10 days. 03/12/23 03/22/23 Yes Desha Bitner, Marcelino Duster A, DO  Acetylcysteine, Nutrient, (N-ACETYL CYSTEINE) 600 MG TABS Take 1 tablet by mouth 2 (two) times daily. 08/16/21   Raulkar, Drema Pry, MD  aspirin-acetaminophen-caffeine (EXCEDRIN MIGRAINE) [phone] MG tablet Take 1 tablet by mouth as needed.    [provider]   atenolol (TENORMIN) 25 MG tablet Take 1 tablet by mouth daily. 10/16/20   [provider]  Cholecalciferol (VITAMIN D) 50 MCG (2000 UT) tablet Take 2,000 Units by mouth daily.    [provider]  ciclopirox (PENLAC) 8 % solution Apply topically. 04/23/21   [provider]  desvenlafaxine (PRISTIQ) 100 MG 24 hr tablet Take 1 tablet by mouth daily. 07/30/20   [provider]  empagliflozin (JARDIANCE) 10 MG TABS tablet Take 1 tablet by mouth daily. 04/11/21   [provider]  gabapentin (NEURONTIN) 300 MG capsule Take 2 capsules by mouth 2 (two) times daily. 04/17/21   [provider]  glipiZIDE (GLUCOTROL XL) 5 MG 24 hr tablet Take 1 tablet by mouth daily. 10/16/20   [provider]  glucose blood (ONETOUCH ULTRA) test strip  02/15/13   [provider]  metFORMIN (GLUCOPHAGE-XR) 500 MG 24 hr tablet Take 2 tablets by mouth 2 (two) times daily. 10/16/20   [provider]  pravastatin (PRAVACHOL) 20 MG tablet Take 1 tablet by mouth every evening. 10/16/20   [provider]    Family History History reviewed. No pertinent family history.  Social History Social History   Tobacco Use   Smoking status: Every Day    Packs/day: 1    Types: Cigarettes   Smokeless tobacco: Never  Vaping Use   Vaping Use: Never used  Substance Use Topics   Alcohol use: Not Currently    Alcohol/week: 2.0 standard drinks of alcohol    Types: 1 Glasses of wine, 1 Cans of beer per week   Drug use: Not Currently    Types: Marijuana     Allergies   Meperidine hcl, Morphine and codeine, Topiramate, Hydrocodone, Oxycodone, and Cyclobenzaprine   Review of Systems Review of Systems as listed above in HPI   Physical Exam Triage Vital Signs ED Triage Vitals  Enc Vitals Group     BP 03/12/23 0952 91/64     Pulse Rate 03/12/23 0952 (!) 104     Resp 03/12/23 0952 18     Temp 03/12/23 0952 98.1 F (36.7 C)     Temp src --       SpO2 03/12/23 0952 95 %     Weight --      Height --      Head Circumference --      Peak Flow --      Pain Score 03/12/23 0951 7     Pain Loc --      Pain Edu? --      Excl. in GC? --    No data found.  Updated Vital Signs BP 91/64   Pulse (!) 104   Temp 98.1 F (36.7 C)   Resp 18   SpO2 95%      Physical Exam Vitals reviewed. Exam conducted with a chaperone present.  Constitutional:      General: She is not in acute distress.    Appearance: Normal appearance. She is not ill-appearing, toxic-appearing or diaphoretic.  Cardiovascular:     Rate and Rhythm: Tachycardia present.  Pulmonary:     Effort: Pulmonary effort is normal.  Abdominal:     Palpations: Abdomen is soft.       Comments: Send area of induration as marked by the red circle in the image above and 2 draining lesions as marked by the purple circles on the image above Per Drainage from the site.  Firm induration with some slight erythema surrounding lesions.  Skin:    General: Skin is warm.  Neurological:     Mental Status: She is alert.  Psychiatric:        Mood and Affect: Mood normal.        Behavior: Behavior normal.        Thought Content: Thought content normal.        Judgment: Judgment normal.      UC Treatments / Results  Labs (all labs ordered are listed, but only abnormal results are displayed) Labs Reviewed - No data to display  EKG   Radiology No results found.  Procedures Procedures (including critical care time)  Medications Ordered in UC Medications - No data to display  Initial Impression / Assessment and Plan / UC Course  I have reviewed the triage vital signs and the nursing notes.  Pertinent labs & imaging results that were available during my care of the patient were reviewed by me and considered in my medical decision making (see chart for details).     Abscess of the mons pubis I have sent to her pharmacy Bactrim 1 pill twice a day for 10 days as well as  ibuprofen to help with her pain.  Skin culture collected, urgent care staff will call if antibiotic needs to be switched.  Recommend she follow-up with her primary care provider  in 1 week or return sooner if symptoms worsen or fail to improve. Final Clinical Impressions(s) / UC Diagnoses   Final diagnoses:  Abscess     Discharge Instructions      For the abscess in your vaginal area I have sent to your pharmacy some oral antibiotics, Bactrim to take 1 pill twice a day for 10 days.  I have also sent some ibuprofen 800 mg that he can take up to 3 times a day for pain.  Take this with food and make sure you are drinking lots of water.  Discontinue the triamcinolone cream that you have been using.  Keep the area clean and dry.  Follow-up with your primary care provider in 1 week   ED Prescriptions     Medication Sig Dispense Auth. Provider   sulfamethoxazole-trimethoprim (BACTRIM DS) 800-160 MG tablet Take 1 tablet by mouth 2 (two) times daily for 10 days. 20 tablet Gillermo Murdoch A, DO   ibuprofen (ADVIL) 800 MG tablet Take 1 tablet (800 mg total) by mouth 3 (three) times daily. 30 tablet Gillermo Murdoch A, DO      PDMP not reviewed this encounter.   Gillermo Murdoch A, DO 03/12/23 1021

## 2023-03-12 NOTE — Discharge Instructions (Addendum)
For the abscess in your vaginal area I have sent to your pharmacy some oral antibiotics, Bactrim to take 1 pill twice a day for 10 days.  I have also sent some ibuprofen 800 mg that he can take up to 3 times a day for pain.  Take this with food and make sure you are drinking lots of water.  Discontinue the triamcinolone cream that you have been using.  Keep the area clean and dry.  Follow-up with your primary care provider in 1 week

## 2023-03-13 LAB — AEROBIC CULTURE W GRAM STAIN (SUPERFICIAL SPECIMEN)

## 2023-03-14 LAB — AEROBIC CULTURE W GRAM STAIN (SUPERFICIAL SPECIMEN)

## 2024-03-03 ENCOUNTER — Encounter (HOSPITAL_COMMUNITY): Payer: Self-pay

## 2024-03-03 ENCOUNTER — Ambulatory Visit (HOSPITAL_COMMUNITY)
Admission: EM | Admit: 2024-03-03 | Discharge: 2024-03-03 | Disposition: A | Discharge status: Home / Self Care | Attending: Internal Medicine | Admitting: Internal Medicine

## 2024-03-03 DIAGNOSIS — L0390 Cellulitis, unspecified: Secondary | ICD-10-CM

## 2024-03-03 DIAGNOSIS — S30860A Insect bite (nonvenomous) of lower back and pelvis, initial encounter: Secondary | ICD-10-CM | POA: Diagnosis not present

## 2024-03-03 DIAGNOSIS — W57XXXA Bitten or stung by nonvenomous insect and other nonvenomous arthropods, initial encounter: Secondary | ICD-10-CM | POA: Diagnosis not present

## 2024-03-03 MED ORDER — DOXYCYCLINE HYCLATE 100 MG PO CAPS: 100.0000 mg | ORAL_CAPSULE | Freq: Two times a day (BID) | ORAL | 0 refills | Status: AC

## 2024-03-03 NOTE — ED Triage Notes (Signed)
 Patient presents with tick bite in the center of her back x 2 days. Patient reports pain and redness.

## 2024-03-03 NOTE — ED Provider Notes (Signed)
 [facility]    CSN: [id number] Arrival date & time: 03/03/24  1908      History   Chief Complaint Chief Complaint  Patient presents with   Tick Removal    HPI Elizabeth [name] is a 53 y.o. female who presents with redness and pain on her back secondary to a tick bite 2 days ago. Her daughter removed it all as far as she knows, but pt gets local reactions from them. She was in [place]  around the woods when this happened. No feer or myalgias.     Past Medical History:  Diagnosis Date   Allergy    Anxiety    Depression    Diabetes mellitus without complication (HCC)    Hyperlipidemia     There are no active problems to display for this patient.   History reviewed. No pertinent surgical history.  OB History   No obstetric history on file.      Home Medications    Prior to Admission medications   Medication Sig Start Date End Date Taking? Authorizing Provider  doxycycline (VIBRAMYCIN) 100 MG capsule Take 1 capsule (100 mg total) by mouth 2 (two) times daily. 03/03/24  Yes Rodriguez-Southworth, Ramonita Koenig, PA-C  Acetylcysteine, Nutrient, (N-ACETYL CYSTEINE) 600 MG TABS Take 1 tablet by mouth 2 (two) times daily. 08/16/21   Raulkar, Keven Pel, MD  aspirin-acetaminophen-caffeine (EXCEDRIN MIGRAINE) 250-250-65 MG tablet Take 1 tablet by mouth as needed.    [provider]  atenolol (TENORMIN) 25 MG tablet Take 1 tablet by mouth daily. 10/16/20   [provider]  Cholecalciferol (VITAMIN D) 50 MCG (2000 UT) tablet Take 2,000 Units by mouth daily.    [provider]  ciclopirox (PENLAC) 8 % solution Apply topically. 04/23/21   [provider]  desvenlafaxine (PRISTIQ) 100 MG 24 hr tablet Take 1 tablet by mouth daily. 07/30/20   [provider]  empagliflozin (JARDIANCE) 10 MG TABS tablet Take 1 tablet by mouth daily. 04/11/21   [provider]  gabapentin (NEURONTIN) 300 MG capsule Take 2 capsules by mouth 2 (two) times daily.  04/17/21   [provider]  glipiZIDE (GLUCOTROL XL) 5 MG 24 hr tablet Take 1 tablet by mouth daily. 10/16/20   [provider]  glucose blood (ONETOUCH ULTRA) test strip  02/15/13   [provider]  ibuprofen  (ADVIL ) 800 MG tablet Take 1 tablet (800 mg total) by mouth 3 (three) times daily. 03/12/23   Aliene Ip, DO  metFORMIN (GLUCOPHAGE-XR) 500 MG 24 hr tablet Take 2 tablets by mouth 2 (two) times daily. 10/16/20   [provider]  pravastatin (PRAVACHOL) 20 MG tablet Take 1 tablet by mouth every evening. 10/16/20   [provider]    Family History History reviewed. No pertinent family history.  Social History Social History   Tobacco Use   Smoking status: Every Day    Current packs/day: 1.00    Types: Cigarettes   Smokeless tobacco: Never  Vaping Use   Vaping status: Never Used  Substance Use Topics   Alcohol use: Not Currently    Alcohol/week: 2.0 standard drinks of alcohol    Types: 1 Glasses of wine, 1 Cans of beer per week   Drug use: Not Currently    Types: Marijuana     Allergies   Meperidine hcl, Morphine and codeine, Topiramate, Hydrocodone, Oxycodone, and Cyclobenzaprine   Review of Systems Review of Systems As noted in HPI  Physical Exam Triage Vital Signs ED Triage  Vitals [03/03/24 1952]  Encounter Vitals Group     BP 114/71     Systolic BP Percentile      Diastolic BP Percentile      Pulse Rate 87     Resp 16     Temp 98.3 F (36.8 C)     Temp Source Oral     SpO2 97 %     Weight      Height      Head Circumference      Peak Flow      Pain Score      Pain Loc      Pain Education      Exclude from Growth Chart    No data found.  Updated Vital Signs BP 114/71 (BP Location: Left Arm)   Pulse 87   Temp 98.3 F (36.8 C) (Oral)   Resp 16   SpO2 97%   Visual Acuity Right Eye Distance:   Left Eye Distance:   Bilateral Distance:    Right Eye Near:   Left Eye Near:    Bilateral  Near:     Physical Exam Vitals and nursing note reviewed.  Constitutional:      General: She is not in acute distress.    Appearance: She is not toxic-appearing.  HENT:     Right Ear: External ear normal.     Left Ear: External ear normal.  Eyes:     General: No scleral icterus.    Conjunctiva/sclera: Conjunctivae normal.  Pulmonary:     Effort: Pulmonary effort is normal.  Musculoskeletal:        General: Normal range of motion.     Cervical back: Neck supple.  Skin:    Comments: Skin around tick bite is raised and erythematous about 2 cm x 5 cm. No bullseye rash present. This area is little warm and tender   Neurological:     Mental Status: She is alert and oriented to person, place, and time.     Gait: Gait normal.  Psychiatric:        Mood and Affect: Mood normal.        Behavior: Behavior normal.        Thought Content: Thought content normal.        Judgment: Judgment normal.      UC Treatments / Results  Labs (all labs ordered are listed, but only abnormal results are displayed) Labs Reviewed - No data to display  EKG   Radiology No results found.  Procedures Procedures (including critical care time)  Medications Ordered in UC Medications - No data to display  Initial Impression / Assessment and Plan / UC Course  I have reviewed the triage vital signs and the nursing notes.  Tick bite Cellulitis  I placed her on Doxy as noted.   Final Clinical Impressions(s) / UC Diagnoses   Final diagnoses:  Tick bite of back, initial encounter  Cellulitis, unspecified cellulitis site   Discharge Instructions   None    ED Prescriptions     Medication Sig Dispense Auth. Provider   doxycycline (VIBRAMYCIN) 100 MG capsule Take 1 capsule (100 mg total) by mouth 2 (two) times daily. 20 capsule Rodriguez-Southworth, Falcon Mccaskey, PA-C      PDMP not reviewed this encounter.   Vonda Guadeloupe, PA-C 03/03/24 2038
